# Patient Record
Sex: Male | Born: 1975 | Race: Black or African American | Hispanic: No | Marital: Single
Health system: Southern US, Community
[De-identification: ages and names within clinical notes are randomized; demographics above are authoritative.]

## PROBLEM LIST (undated history)

## (undated) DIAGNOSIS — D649 Anemia, unspecified: Secondary | ICD-10-CM

## (undated) DIAGNOSIS — F32A Depression, unspecified: Secondary | ICD-10-CM

## (undated) DIAGNOSIS — D709 Neutropenia, unspecified: Secondary | ICD-10-CM

## (undated) DIAGNOSIS — F329 Major depressive disorder, single episode, unspecified: Secondary | ICD-10-CM

## (undated) DIAGNOSIS — F419 Anxiety disorder, unspecified: Secondary | ICD-10-CM

## (undated) DIAGNOSIS — F209 Schizophrenia, unspecified: Secondary | ICD-10-CM

## (undated) DIAGNOSIS — G8220 Paraplegia, unspecified: Secondary | ICD-10-CM

---

## 2013-11-29 ENCOUNTER — Ambulatory Visit: Payer: Self-pay | Admitting: Internal Medicine

## 2013-12-07 ENCOUNTER — Inpatient Hospital Stay: Payer: Self-pay | Admitting: Internal Medicine

## 2013-12-07 ENCOUNTER — Encounter: Payer: Self-pay | Admitting: Surgery

## 2013-12-07 LAB — CBC
HCT: 24.8 % — ABNORMAL LOW (ref 40.0–52.0)
HGB: 7 g/dL — ABNORMAL LOW (ref 13.0–18.0)
MCH: 18.6 pg — AB (ref 26.0–34.0)
MCHC: 28 g/dL — ABNORMAL LOW (ref 32.0–36.0)
MCV: 66 fL — ABNORMAL LOW (ref 80–100)
Platelet: 544 10*3/uL — ABNORMAL HIGH (ref 150–440)
RBC: 3.75 10*6/uL — ABNORMAL LOW (ref 4.40–5.90)
RDW: 24.6 % — ABNORMAL HIGH (ref 11.5–14.5)
WBC: 4.6 10*3/uL (ref 3.8–10.6)

## 2013-12-07 LAB — IRON AND TIBC
IRON BIND. CAP.(TOTAL): 44 ug/dL — AB (ref 250–450)
Iron Saturation: 27 %
Iron: 12 ug/dL — ABNORMAL LOW (ref 65–175)
Unbound Iron-Bind.Cap.: 32 ug/dL

## 2013-12-07 LAB — COMPREHENSIVE METABOLIC PANEL
ANION GAP: 9 (ref 7–16)
Albumin: 0.7 g/dL — ABNORMAL LOW (ref 3.4–5.0)
Alkaline Phosphatase: 86 U/L
BILIRUBIN TOTAL: 0.3 mg/dL (ref 0.2–1.0)
BUN: 4 mg/dL — AB (ref 7–18)
CO2: 18 mmol/L — AB (ref 21–32)
Chloride: 118 mmol/L — ABNORMAL HIGH (ref 98–107)
Creatinine: 0.37 mg/dL — ABNORMAL LOW (ref 0.60–1.30)
Glucose: 61 mg/dL — ABNORMAL LOW (ref 65–99)
OSMOLALITY: 284 (ref 275–301)
POTASSIUM: 2.5 mmol/L — AB (ref 3.5–5.1)
SGOT(AST): 7 U/L — ABNORMAL LOW (ref 15–37)
SODIUM: 145 mmol/L (ref 136–145)
TOTAL PROTEIN: 4.4 g/dL — AB (ref 6.4–8.2)

## 2013-12-07 LAB — URINALYSIS, COMPLETE
BILIRUBIN, UR: NEGATIVE
Blood: NEGATIVE
GLUCOSE, UR: NEGATIVE mg/dL (ref 0–75)
Ketone: NEGATIVE
Nitrite: POSITIVE
Ph: 9 (ref 4.5–8.0)
Protein: 100
RBC,UR: 15 /HPF (ref 0–5)
Specific Gravity: 1.015 (ref 1.003–1.030)
Squamous Epithelial: NONE SEEN

## 2013-12-07 LAB — BASIC METABOLIC PANEL
Anion Gap: 7 (ref 7–16)
BUN: 6 mg/dL — AB (ref 7–18)
CREATININE: 0.5 mg/dL — AB (ref 0.60–1.30)
Calcium, Total: 7.4 mg/dL — ABNORMAL LOW (ref 8.5–10.1)
Chloride: 105 mmol/L (ref 98–107)
Co2: 27 mmol/L (ref 21–32)
Glucose: 90 mg/dL (ref 65–99)
OSMOLALITY: 275 (ref 275–301)
Potassium: 3.7 mmol/L (ref 3.5–5.1)
SODIUM: 139 mmol/L (ref 136–145)

## 2013-12-07 LAB — CK TOTAL AND CKMB (NOT AT ARMC)
CK, Total: 23 U/L — ABNORMAL LOW
CK-MB: 0.6 ng/mL (ref 0.5–3.6)

## 2013-12-07 LAB — TSH: THYROID STIMULATING HORM: 1.39 u[IU]/mL

## 2013-12-07 LAB — MAGNESIUM: Magnesium: 1.2 mg/dL — ABNORMAL LOW

## 2013-12-07 LAB — PHOSPHORUS: Phosphorus: 2 mg/dL — ABNORMAL LOW (ref 2.5–4.9)

## 2013-12-08 LAB — BASIC METABOLIC PANEL
ANION GAP: 7 (ref 7–16)
BUN: 3 mg/dL — ABNORMAL LOW (ref 7–18)
CALCIUM: 7.3 mg/dL — AB (ref 8.5–10.1)
CREATININE: 0.62 mg/dL (ref 0.60–1.30)
Chloride: 110 mmol/L — ABNORMAL HIGH (ref 98–107)
Co2: 26 mmol/L (ref 21–32)
EGFR (African American): >60
EGFR (Non-African Amer.): >60
Glucose: 117 mg/dL — ABNORMAL HIGH (ref 65–99)
Osmolality: 283 (ref 275–301)
Potassium: 3.6 mmol/L (ref 3.5–5.1)
Sodium: 143 mmol/L (ref 136–145)

## 2013-12-08 LAB — CBC WITH DIFFERENTIAL/PLATELET
BASOS ABS: 0 10*3/uL (ref 0.0–0.1)
BASOS PCT: 0.7 %
Eosinophil #: 0.1 10*3/uL (ref 0.0–0.7)
Eosinophil %: 1.9 %
HCT: 24.2 % — AB (ref 40.0–52.0)
HGB: 7.2 g/dL — ABNORMAL LOW (ref 13.0–18.0)
LYMPHS PCT: 35.4 %
Lymphocyte #: 1.4 10*3/uL (ref 1.0–3.6)
MCH: 19.9 pg — ABNORMAL LOW (ref 26.0–34.0)
MCHC: 29.6 g/dL — ABNORMAL LOW (ref 32.0–36.0)
MCV: 67 fL — ABNORMAL LOW (ref 80–100)
MONOS PCT: 15.3 %
Monocyte #: 0.6 x10 3/mm (ref 0.2–1.0)
NEUTROS ABS: 1.8 10*3/uL (ref 1.4–6.5)
Neutrophil %: 46.7 %
Platelet: 536 10*3/uL — ABNORMAL HIGH (ref 150–440)
RBC: 3.61 10*6/uL — AB (ref 4.40–5.90)
RDW: 24.7 % — AB (ref 11.5–14.5)
WBC: 4 10*3/uL (ref 3.8–10.6)

## 2013-12-08 LAB — MAGNESIUM: MAGNESIUM: 2 mg/dL

## 2013-12-09 LAB — BASIC METABOLIC PANEL
ANION GAP: 7 (ref 7–16)
BUN: 4 mg/dL — AB (ref 7–18)
Calcium, Total: 7.2 mg/dL — ABNORMAL LOW (ref 8.5–10.1)
Chloride: 112 mmol/L — ABNORMAL HIGH (ref 98–107)
Co2: 25 mmol/L (ref 21–32)
Creatinine: 0.57 mg/dL — ABNORMAL LOW (ref 0.60–1.30)
EGFR (African American): >60
GLUCOSE: 92 mg/dL (ref 65–99)
Osmolality: 283 (ref 275–301)
POTASSIUM: 3.4 mmol/L — AB (ref 3.5–5.1)
Sodium: 144 mmol/L (ref 136–145)

## 2013-12-09 LAB — CBC WITH DIFFERENTIAL/PLATELET
BASOS ABS: 0 10*3/uL (ref 0.0–0.1)
BASOS PCT: 0.7 %
Eosinophil #: 0.1 10*3/uL (ref 0.0–0.7)
Eosinophil %: 2.4 %
HCT: 24 % — ABNORMAL LOW (ref 40.0–52.0)
HGB: 7.1 g/dL — AB (ref 13.0–18.0)
Lymphocyte #: 1.3 10*3/uL (ref 1.0–3.6)
Lymphocyte %: 39.4 %
MCH: 19.4 pg — ABNORMAL LOW (ref 26.0–34.0)
MCHC: 29.4 g/dL — ABNORMAL LOW (ref 32.0–36.0)
MCV: 66 fL — ABNORMAL LOW (ref 80–100)
Monocyte #: 0.4 x10 3/mm (ref 0.2–1.0)
Monocyte %: 13.2 %
NEUTROS ABS: 1.5 10*3/uL (ref 1.4–6.5)
NEUTROS PCT: 44.3 %
PLATELETS: 568 10*3/uL — AB (ref 150–440)
RBC: 3.65 10*6/uL — ABNORMAL LOW (ref 4.40–5.90)
RDW: 24.8 % — AB (ref 11.5–14.5)
WBC: 3.4 10*3/uL — ABNORMAL LOW (ref 3.8–10.6)

## 2013-12-09 LAB — VANCOMYCIN, TROUGH: VANCOMYCIN, TROUGH: 10 ug/mL (ref 10–20)

## 2013-12-10 LAB — BASIC METABOLIC PANEL
ANION GAP: 4 — AB (ref 7–16)
BUN: 5 mg/dL — ABNORMAL LOW (ref 7–18)
CREATININE: 0.61 mg/dL (ref 0.60–1.30)
Calcium, Total: 7.4 mg/dL — ABNORMAL LOW (ref 8.5–10.1)
Chloride: 111 mmol/L — ABNORMAL HIGH (ref 98–107)
Co2: 30 mmol/L (ref 21–32)
EGFR (African American): >60
EGFR (Non-African Amer.): >60
Glucose: 79 mg/dL (ref 65–99)
Osmolality: 285 (ref 275–301)
Potassium: 3.7 mmol/L (ref 3.5–5.1)
Sodium: 145 mmol/L (ref 136–145)

## 2013-12-10 LAB — PHOSPHORUS: Phosphorus: 3.6 mg/dL (ref 2.5–4.9)

## 2013-12-10 LAB — VANCOMYCIN, TROUGH: Vancomycin, Trough: 36 ug/mL (ref 10–20)

## 2013-12-10 LAB — MAGNESIUM: Magnesium: 2 mg/dL

## 2013-12-11 LAB — BASIC METABOLIC PANEL
ANION GAP: 5 — AB (ref 7–16)
BUN: 4 mg/dL — ABNORMAL LOW (ref 7–18)
CHLORIDE: 113 mmol/L — AB (ref 98–107)
CO2: 29 mmol/L (ref 21–32)
Calcium, Total: 7.4 mg/dL — ABNORMAL LOW (ref 8.5–10.1)
Creatinine: 0.69 mg/dL (ref 0.60–1.30)
EGFR (Non-African Amer.): >60
Glucose: 64 mg/dL — ABNORMAL LOW (ref 65–99)
Osmolality: 287 (ref 275–301)
Potassium: 3.5 mmol/L (ref 3.5–5.1)
SODIUM: 147 mmol/L — AB (ref 136–145)

## 2013-12-11 LAB — CBC WITH DIFFERENTIAL/PLATELET
BASOS PCT: 0.7 %
Basophil #: 0 10*3/uL (ref 0.0–0.1)
Eosinophil #: 0.1 10*3/uL (ref 0.0–0.7)
Eosinophil %: 2.1 %
HCT: 24.2 % — AB (ref 40.0–52.0)
HGB: 6.9 g/dL — ABNORMAL LOW (ref 13.0–18.0)
Lymphocyte #: 1.5 10*3/uL (ref 1.0–3.6)
Lymphocyte %: 32.1 %
MCH: 19.1 pg — ABNORMAL LOW (ref 26.0–34.0)
MCHC: 28.6 g/dL — AB (ref 32.0–36.0)
MCV: 67 fL — ABNORMAL LOW (ref 80–100)
MONOS PCT: 12.5 %
Monocyte #: 0.6 x10 3/mm (ref 0.2–1.0)
NEUTROS PCT: 52.6 %
Neutrophil #: 2.5 10*3/uL (ref 1.4–6.5)
Platelet: 573 10*3/uL — ABNORMAL HIGH (ref 150–440)
RBC: 3.62 10*6/uL — ABNORMAL LOW (ref 4.40–5.90)
RDW: 24.6 % — ABNORMAL HIGH (ref 11.5–14.5)
WBC: 4.7 10*3/uL (ref 3.8–10.6)

## 2013-12-11 LAB — VANCOMYCIN, TROUGH: VANCOMYCIN, TROUGH: 22 ug/mL — AB (ref 10–20)

## 2013-12-12 LAB — CBC WITH DIFFERENTIAL/PLATELET
BASOS ABS: 0.1 10*3/uL (ref 0.0–0.1)
BASOS PCT: 1 %
EOS PCT: 1 %
Eosinophil #: 0.1 10*3/uL (ref 0.0–0.7)
HCT: 26.8 % — AB (ref 40.0–52.0)
HGB: 7.6 g/dL — ABNORMAL LOW (ref 13.0–18.0)
LYMPHS ABS: 1.5 10*3/uL (ref 1.0–3.6)
Lymphocyte %: 18.5 %
MCH: 19 pg — ABNORMAL LOW (ref 26.0–34.0)
MCHC: 28.5 g/dL — ABNORMAL LOW (ref 32.0–36.0)
MCV: 67 fL — ABNORMAL LOW (ref 80–100)
MONOS PCT: 14.1 %
Monocyte #: 1.1 x10 3/mm — ABNORMAL HIGH (ref 0.2–1.0)
NEUTROS ABS: 5.2 10*3/uL (ref 1.4–6.5)
NEUTROS PCT: 65.4 %
PLATELETS: 658 10*3/uL — AB (ref 150–440)
RBC: 4.01 10*6/uL — ABNORMAL LOW (ref 4.40–5.90)
RDW: 24.6 % — AB (ref 11.5–14.5)
WBC: 7.9 10*3/uL (ref 3.8–10.6)

## 2013-12-12 LAB — CULTURE, BLOOD (SINGLE)

## 2013-12-12 LAB — VANCOMYCIN, TROUGH: Vancomycin, Trough: 16 ug/mL (ref 10–20)

## 2013-12-12 LAB — WOUND CULTURE

## 2013-12-13 LAB — VANCOMYCIN, TROUGH: VANCOMYCIN, TROUGH: 16 ug/mL (ref 10–20)

## 2013-12-16 LAB — CULTURE, BLOOD (SINGLE)

## 2013-12-30 ENCOUNTER — Ambulatory Visit
Admit: 2013-12-30 | Disposition: A | Discharge status: Home / Self Care | Payer: Self-pay | Attending: Nurse Practitioner | Admitting: Nurse Practitioner

## 2013-12-30 ENCOUNTER — Ambulatory Visit: Payer: Self-pay | Admitting: Internal Medicine

## 2014-01-04 ENCOUNTER — Emergency Department: Payer: Self-pay | Admitting: Student

## 2014-02-07 ENCOUNTER — Encounter: Payer: Self-pay | Admitting: Surgery

## 2014-02-16 LAB — WOUND AEROBIC CULTURE

## 2014-03-01 ENCOUNTER — Encounter: Payer: Self-pay | Admitting: Surgery

## 2014-03-27 ENCOUNTER — Inpatient Hospital Stay: Payer: Self-pay | Admitting: Internal Medicine

## 2014-03-27 LAB — TROPONIN I

## 2014-03-27 LAB — URINALYSIS, COMPLETE
BILIRUBIN, UR: NEGATIVE
BLOOD: NEGATIVE
Glucose,UR: NEGATIVE mg/dL (ref 0–75)
KETONE: NEGATIVE
Nitrite: NEGATIVE
PH: 8 (ref 4.5–8.0)
RBC,UR: NONE SEEN /HPF (ref 0–5)
Specific Gravity: 1.012 (ref 1.003–1.030)
Squamous Epithelial: NONE SEEN

## 2014-03-27 LAB — PROTIME-INR
INR: 1.4
Prothrombin Time: 16.6 secs — ABNORMAL HIGH (ref 11.5–14.7)

## 2014-03-27 LAB — CBC WITH DIFFERENTIAL/PLATELET
BASOS ABS: 0 10*3/uL (ref 0.0–0.1)
Basophil %: 0.5 %
Eosinophil #: 0 10*3/uL (ref 0.0–0.7)
Eosinophil %: 0.2 %
HCT: 29.6 % — AB (ref 40.0–52.0)
HGB: 9 g/dL — AB (ref 13.0–18.0)
Lymphocyte #: 0.8 10*3/uL — ABNORMAL LOW (ref 1.0–3.6)
Lymphocyte %: 18.9 %
MCH: 22.1 pg — ABNORMAL LOW (ref 26.0–34.0)
MCHC: 30.5 g/dL — ABNORMAL LOW (ref 32.0–36.0)
MCV: 73 fL — AB (ref 80–100)
MONO ABS: 0.7 x10 3/mm (ref 0.2–1.0)
Monocyte %: 15.9 %
Neutrophil #: 2.9 10*3/uL (ref 1.4–6.5)
Neutrophil %: 64.5 %
Platelet: 421 10*3/uL (ref 150–440)
RBC: 4.07 10*6/uL — AB (ref 4.40–5.90)
RDW: 22.2 % — AB (ref 11.5–14.5)
WBC: 4.5 10*3/uL (ref 3.8–10.6)

## 2014-03-27 LAB — COMPREHENSIVE METABOLIC PANEL
ALT: 8 U/L — AB
Albumin: 1.1 g/dL — ABNORMAL LOW (ref 3.4–5.0)
Alkaline Phosphatase: 117 U/L — ABNORMAL HIGH
Anion Gap: 10 (ref 7–16)
BILIRUBIN TOTAL: 0.3 mg/dL (ref 0.2–1.0)
BUN: 6 mg/dL — AB (ref 7–18)
CO2: 27 mmol/L (ref 21–32)
Calcium, Total: 7.3 mg/dL — ABNORMAL LOW (ref 8.5–10.1)
Chloride: 104 mmol/L (ref 98–107)
Creatinine: 0.52 mg/dL — ABNORMAL LOW (ref 0.60–1.30)
EGFR (African American): >60
EGFR (Non-African Amer.): >60
GLUCOSE: 133 mg/dL — AB (ref 65–99)
OSMOLALITY: 281 (ref 275–301)
Potassium: 3.6 mmol/L (ref 3.5–5.1)
SGOT(AST): 31 U/L (ref 15–37)
Sodium: 141 mmol/L (ref 136–145)
TOTAL PROTEIN: 6 g/dL — AB (ref 6.4–8.2)

## 2014-03-27 LAB — PHOSPHORUS: Phosphorus: 3.4 mg/dL (ref 2.5–4.9)

## 2014-03-27 LAB — MAGNESIUM: Magnesium: 1.5 mg/dL — ABNORMAL LOW

## 2014-03-28 LAB — CBC WITH DIFFERENTIAL/PLATELET
BASOS ABS: 0 10*3/uL (ref 0.0–0.1)
BASOS PCT: 0.4 %
Eosinophil #: 0.1 10*3/uL (ref 0.0–0.7)
Eosinophil %: 2.2 %
HCT: 24.6 % — ABNORMAL LOW (ref 40.0–52.0)
HGB: 7.3 g/dL — ABNORMAL LOW (ref 13.0–18.0)
Lymphocyte #: 0.7 10*3/uL — ABNORMAL LOW (ref 1.0–3.6)
Lymphocyte %: 27.4 %
MCH: 21.6 pg — ABNORMAL LOW (ref 26.0–34.0)
MCHC: 29.7 g/dL — AB (ref 32.0–36.0)
MCV: 73 fL — ABNORMAL LOW (ref 80–100)
Monocyte #: 0.4 x10 3/mm (ref 0.2–1.0)
Monocyte %: 14.8 %
NEUTROS PCT: 55.2 %
Neutrophil #: 1.4 10*3/uL (ref 1.4–6.5)
Platelet: 355 10*3/uL (ref 150–440)
RBC: 3.38 10*6/uL — ABNORMAL LOW (ref 4.40–5.90)
RDW: 21.7 % — ABNORMAL HIGH (ref 11.5–14.5)
WBC: 2.6 10*3/uL — ABNORMAL LOW (ref 3.8–10.6)

## 2014-03-28 LAB — BASIC METABOLIC PANEL
ANION GAP: 7 (ref 7–16)
BUN: 4 mg/dL — ABNORMAL LOW (ref 7–18)
CALCIUM: 6.6 mg/dL — AB (ref 8.5–10.1)
CO2: 26 mmol/L (ref 21–32)
CREATININE: 0.5 mg/dL — AB (ref 0.60–1.30)
Chloride: 114 mmol/L — ABNORMAL HIGH (ref 98–107)
EGFR (African American): >60
EGFR (Non-African Amer.): >60
Glucose: 104 mg/dL — ABNORMAL HIGH (ref 65–99)
Osmolality: 290 (ref 275–301)
POTASSIUM: 2.9 mmol/L — AB (ref 3.5–5.1)
Sodium: 147 mmol/L — ABNORMAL HIGH (ref 136–145)

## 2014-03-28 LAB — IRON AND TIBC
IRON: 15 ug/dL — AB (ref 65–175)
Iron Bind.Cap.(Total): 76 ug/dL — ABNORMAL LOW (ref 250–450)
Iron Saturation: 20 %
UNBOUND IRON-BIND. CAP.: 61 ug/dL

## 2014-03-28 LAB — URINE CULTURE

## 2014-03-28 LAB — VANCOMYCIN, TROUGH: Vancomycin, Trough: 19 ug/mL (ref 10–20)

## 2014-03-28 LAB — FERRITIN: Ferritin (ARMC): 38 ng/mL (ref 8–388)

## 2014-03-28 LAB — OCCULT BLOOD X 1 CARD TO LAB, STOOL: OCCULT BLOOD, FECES: NEGATIVE

## 2014-03-29 LAB — CBC WITH DIFFERENTIAL/PLATELET
BASOS PCT: 0.3 %
Basophil #: 0 10*3/uL (ref 0.0–0.1)
Eosinophil #: 0.1 10*3/uL (ref 0.0–0.7)
Eosinophil %: 2.2 %
HCT: 25.8 % — ABNORMAL LOW (ref 40.0–52.0)
HGB: 7.7 g/dL — AB (ref 13.0–18.0)
LYMPHS PCT: 37.4 %
Lymphocyte #: 1.1 10*3/uL (ref 1.0–3.6)
MCH: 22 pg — AB (ref 26.0–34.0)
MCHC: 30 g/dL — AB (ref 32.0–36.0)
MCV: 73 fL — ABNORMAL LOW (ref 80–100)
MONOS PCT: 14.3 %
Monocyte #: 0.4 x10 3/mm (ref 0.2–1.0)
NEUTROS PCT: 45.8 %
Neutrophil #: 1.4 10*3/uL (ref 1.4–6.5)
Platelet: 414 10*3/uL (ref 150–440)
RBC: 3.52 10*6/uL — AB (ref 4.40–5.90)
RDW: 22.3 % — AB (ref 11.5–14.5)
WBC: 3 10*3/uL — AB (ref 3.8–10.6)

## 2014-03-29 LAB — ALBUMIN: ALBUMIN: 1 g/dL — AB (ref 3.4–5.0)

## 2014-03-29 LAB — VANCOMYCIN, TROUGH: Vancomycin, Trough: 18 ug/mL (ref 10–20)

## 2014-03-29 LAB — BASIC METABOLIC PANEL
ANION GAP: 4 — AB (ref 7–16)
BUN: 3 mg/dL — AB (ref 7–18)
CALCIUM: 6.7 mg/dL — AB (ref 8.5–10.1)
CHLORIDE: 116 mmol/L — AB (ref 98–107)
Co2: 27 mmol/L (ref 21–32)
Creatinine: 0.54 mg/dL — ABNORMAL LOW (ref 0.60–1.30)
EGFR (African American): >60
EGFR (Non-African Amer.): >60
Glucose: 100 mg/dL — ABNORMAL HIGH (ref 65–99)
Osmolality: 289 (ref 275–301)
POTASSIUM: 3.6 mmol/L (ref 3.5–5.1)
SODIUM: 147 mmol/L — AB (ref 136–145)

## 2014-03-29 LAB — MAGNESIUM: Magnesium: 1.8 mg/dL

## 2014-03-30 LAB — CBC WITH DIFFERENTIAL/PLATELET
BASOS ABS: 0 10*3/uL (ref 0.0–0.1)
Basophil %: 0.2 %
Eosinophil #: 0.1 10*3/uL (ref 0.0–0.7)
Eosinophil %: 2.8 %
HCT: 25.6 % — AB (ref 40.0–52.0)
HGB: 7.8 g/dL — AB (ref 13.0–18.0)
LYMPHS ABS: 1.1 10*3/uL (ref 1.0–3.6)
Lymphocyte %: 35.6 %
MCH: 22.1 pg — ABNORMAL LOW (ref 26.0–34.0)
MCHC: 30.3 g/dL — ABNORMAL LOW (ref 32.0–36.0)
MCV: 73 fL — ABNORMAL LOW (ref 80–100)
MONOS PCT: 11.8 %
Monocyte #: 0.4 x10 3/mm (ref 0.2–1.0)
Neutrophil #: 1.5 10*3/uL (ref 1.4–6.5)
Neutrophil %: 49.6 %
Platelet: 432 10*3/uL (ref 150–440)
RBC: 3.51 10*6/uL — ABNORMAL LOW (ref 4.40–5.90)
RDW: 21.7 % — ABNORMAL HIGH (ref 11.5–14.5)
WBC: 3.1 10*3/uL — ABNORMAL LOW (ref 3.8–10.6)

## 2014-03-30 LAB — BASIC METABOLIC PANEL
Anion Gap: 4 — ABNORMAL LOW (ref 7–16)
BUN: 4 mg/dL — AB (ref 7–18)
CHLORIDE: 116 mmol/L — AB (ref 98–107)
Calcium, Total: 6.7 mg/dL — CL (ref 8.5–10.1)
Co2: 26 mmol/L (ref 21–32)
Creatinine: 0.63 mg/dL (ref 0.60–1.30)
EGFR (African American): >60
EGFR (Non-African Amer.): >60
GLUCOSE: 230 mg/dL — AB (ref 65–99)
Osmolality: 295 (ref 275–301)
Potassium: 5.5 mmol/L — ABNORMAL HIGH (ref 3.5–5.1)
Sodium: 146 mmol/L — ABNORMAL HIGH (ref 136–145)

## 2014-03-30 LAB — POTASSIUM: Potassium: 4.4 mmol/L (ref 3.5–5.1)

## 2014-03-30 LAB — ALBUMIN: Albumin: 1 g/dL — ABNORMAL LOW (ref 3.4–5.0)

## 2014-03-31 LAB — URINE CULTURE

## 2014-04-01 LAB — CULTURE, BLOOD (SINGLE)

## 2014-06-01 ENCOUNTER — Ambulatory Visit
Admit: 2014-06-01 | Disposition: A | Discharge status: Home / Self Care | Payer: Self-pay | Attending: Nurse Practitioner | Admitting: Nurse Practitioner

## 2014-08-31 ENCOUNTER — Ambulatory Visit: Payer: Self-pay | Admitting: Nurse Practitioner

## 2014-09-22 NOTE — Consult Note (Signed)
Brief Consult Note: Diagnosis: massive sacral decubitus ulcer, severe malnutrition, traumatic paraplegia.   Patient was seen by consultant.   Consult note dictated.   Discussed with Attending MD.   Comments: not source of sepsis on admission. no surgical intervention needed at this time would refer to OP tertiary care center for plastic surgery evaluation.  Electronic Signatures: Natale LayBird, Oseph Imburgia (MD)  (Signed 29-Oct-15 11:29)  Authored: Brief Consult Note   Last Updated: 29-Oct-15 11:29 by Natale LayBird, Mikaelah Trostle (MD)
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
## 2015-03-14 ENCOUNTER — Emergency Department: Payer: Medicare Other

## 2015-03-14 ENCOUNTER — Encounter: Payer: Self-pay | Admitting: Emergency Medicine

## 2015-03-14 ENCOUNTER — Emergency Department
Admission: EM | Admit: 2015-03-14 | Discharge: 2015-03-14 | Payer: Medicare Other | Attending: Emergency Medicine | Admitting: Emergency Medicine

## 2015-03-14 DIAGNOSIS — F209 Schizophrenia, unspecified: Secondary | ICD-10-CM | POA: Diagnosis not present

## 2015-03-14 DIAGNOSIS — I959 Hypotension, unspecified: Secondary | ICD-10-CM | POA: Diagnosis not present

## 2015-03-14 DIAGNOSIS — E876 Hypokalemia: Secondary | ICD-10-CM | POA: Diagnosis not present

## 2015-03-14 DIAGNOSIS — Z79899 Other long term (current) drug therapy: Secondary | ICD-10-CM | POA: Diagnosis not present

## 2015-03-14 HISTORY — DX: Neutropenia, unspecified: D70.9

## 2015-03-14 HISTORY — DX: Depression, unspecified: F32.A

## 2015-03-14 HISTORY — DX: Major depressive disorder, single episode, unspecified: F32.9

## 2015-03-14 HISTORY — DX: Paraplegia, unspecified: G82.20

## 2015-03-14 HISTORY — DX: Schizophrenia, unspecified: F20.9

## 2015-03-14 HISTORY — DX: Anemia, unspecified: D64.9

## 2015-03-14 HISTORY — DX: Anxiety disorder, unspecified: F41.9

## 2015-03-14 LAB — CBC WITH DIFFERENTIAL/PLATELET
Basophils Absolute: 0 10*3/uL (ref 0–0.1)
Basophils Relative: 1 %
EOS ABS: 0 10*3/uL (ref 0–0.7)
Eosinophils Relative: 0 %
HEMATOCRIT: 28.1 % — AB (ref 40.0–52.0)
Hemoglobin: 8.5 g/dL — ABNORMAL LOW (ref 13.0–18.0)
LYMPHS ABS: 2.2 10*3/uL (ref 1.0–3.6)
Lymphocytes Relative: 26 %
MCH: 22.5 pg — AB (ref 26.0–34.0)
MCHC: 30.2 g/dL — AB (ref 32.0–36.0)
MCV: 74.6 fL — ABNORMAL LOW (ref 80.0–100.0)
MONOS PCT: 11 %
Monocytes Absolute: 1 10*3/uL (ref 0.2–1.0)
NEUTROS ABS: 5.2 10*3/uL (ref 1.4–6.5)
NEUTROS PCT: 62 %
Platelets: 592 10*3/uL — ABNORMAL HIGH (ref 150–440)
RBC: 3.77 MIL/uL — AB (ref 4.40–5.90)
RDW: 22.8 % — ABNORMAL HIGH (ref 11.5–14.5)
WBC: 8.4 10*3/uL (ref 3.8–10.6)

## 2015-03-14 LAB — COMPREHENSIVE METABOLIC PANEL
ALT: 11 U/L — ABNORMAL LOW (ref 17–63)
ANION GAP: 9 (ref 5–15)
AST: 20 U/L (ref 15–41)
Albumin: 1.1 g/dL — ABNORMAL LOW (ref 3.5–5.0)
Alkaline Phosphatase: 118 U/L (ref 38–126)
BUN: 6 mg/dL (ref 6–20)
CHLORIDE: 107 mmol/L (ref 101–111)
CO2: 19 mmol/L — AB (ref 22–32)
Calcium: 7.1 mg/dL — ABNORMAL LOW (ref 8.9–10.3)
Creatinine, Ser: 0.39 mg/dL — ABNORMAL LOW (ref 0.61–1.24)
Glucose, Bld: 77 mg/dL (ref 65–99)
POTASSIUM: 2.6 mmol/L — AB (ref 3.5–5.1)
SODIUM: 135 mmol/L (ref 135–145)
Total Bilirubin: 0.5 mg/dL (ref 0.3–1.2)
Total Protein: 5.2 g/dL — ABNORMAL LOW (ref 6.5–8.1)

## 2015-03-14 LAB — TROPONIN I

## 2015-03-14 LAB — BLOOD GAS, VENOUS
Acid-base deficit: 4.5 mmol/L — ABNORMAL HIGH (ref 0.0–2.0)
Bicarbonate: 19.2 mEq/L — ABNORMAL LOW (ref 21.0–28.0)
FIO2: 0.21
O2 SAT: 63.6 %
PCO2 VEN: 31 mmHg — AB (ref 44.0–60.0)
PH VEN: 7.4 (ref 7.320–7.430)
PO2 VEN: 33 mmHg (ref 30.0–45.0)
Patient temperature: 37

## 2015-03-14 LAB — VALPROIC ACID LEVEL: Valproic Acid Lvl: <10 ug/mL — ABNORMAL LOW (ref 50.0–100.0)

## 2015-03-14 LAB — URINALYSIS COMPLETE WITH MICROSCOPIC (ARMC ONLY)
BILIRUBIN URINE: NEGATIVE
GLUCOSE, UA: NEGATIVE mg/dL
HGB URINE DIPSTICK: NEGATIVE
KETONES UR: NEGATIVE mg/dL
NITRITE: NEGATIVE
PH: 8 (ref 5.0–8.0)
Protein, ur: 100 mg/dL — AB
SPECIFIC GRAVITY, URINE: 1.01 (ref 1.005–1.030)
Squamous Epithelial / LPF: NONE SEEN

## 2015-03-14 LAB — LACTIC ACID, PLASMA: Lactic Acid, Venous: 1.3 mmol/L (ref 0.5–2.0)

## 2015-03-14 LAB — PROTIME-INR
INR: 1.51
PROTHROMBIN TIME: 18.4 s — AB (ref 11.4–15.0)

## 2015-03-14 LAB — MAGNESIUM: Magnesium: 1.6 mg/dL — ABNORMAL LOW (ref 1.7–2.4)

## 2015-03-14 MED ORDER — SODIUM CHLORIDE 0.9 % IV BOLUS (SEPSIS): 500.0000 mL | Freq: Once | INTRAVENOUS | Status: DC

## 2015-03-14 MED ORDER — SODIUM CHLORIDE 0.9 % IV BOLUS (SEPSIS)
1000.0000 mL | INTRAVENOUS | Status: AC
  Administered 2015-03-14 (×2): 1000 mL via INTRAVENOUS

## 2015-03-14 MED ORDER — PIPERACILLIN-TAZOBACTAM 3.375 G IVPB
3.3750 g | Freq: Three times a day (TID) | INTRAVENOUS | Status: DC
  Administered 2015-03-14: 3.375 g via INTRAVENOUS
  Filled 2015-03-14: qty 50

## 2015-03-14 MED ORDER — POTASSIUM CHLORIDE CRYS ER 20 MEQ PO TBCR
20.0000 meq | EXTENDED_RELEASE_TABLET | Freq: Once | ORAL | Status: AC
  Administered 2015-03-14: 20 meq via ORAL

## 2015-03-14 MED ORDER — POTASSIUM CHLORIDE CRYS ER 20 MEQ PO TBCR
40.0000 meq | EXTENDED_RELEASE_TABLET | Freq: Once | ORAL | Status: AC
  Administered 2015-03-14: 40 meq via ORAL
  Filled 2015-03-14: qty 2

## 2015-03-14 MED ORDER — SODIUM CHLORIDE 0.9 % IV BOLUS (SEPSIS): 1000.0000 mL | Freq: Once | INTRAVENOUS | Status: DC

## 2015-03-14 MED ORDER — VANCOMYCIN HCL IN DEXTROSE 1-5 GM/200ML-% IV SOLN
1000.0000 mg | Freq: Once | INTRAVENOUS | Status: AC
  Administered 2015-03-14: 1000 mg via INTRAVENOUS
  Filled 2015-03-14: qty 200

## 2015-03-14 MED ORDER — PIPERACILLIN-TAZOBACTAM 3.375 G IVPB 30 MIN
3.3750 g | Freq: Once | INTRAVENOUS | Status: AC
  Administered 2015-03-14: 3.375 g via INTRAVENOUS
  Filled 2015-03-14: qty 50

## 2015-03-14 MED ORDER — POTASSIUM CHLORIDE CRYS ER 20 MEQ PO TBCR
40.0000 meq | EXTENDED_RELEASE_TABLET | Freq: Once | ORAL | Status: DC
  Filled 2015-03-14: qty 2
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
## 2015-03-14 NOTE — ED Notes (Signed)
Pt refused to get an IV catheter inserted. MD notified.

## 2015-03-14 NOTE — Progress Notes (Signed)
ANTIBIOTIC CONSULT NOTE - INITIAL  Pharmacy Consult for Zosyn Indication: rule out sepsis  No Known Allergies  Patient Measurements: Height: 5\' 9"  (175.3 cm) Weight: 122 lb 8 oz (55.566 kg) IBW/kg (Calculated) : 70.7  Vital Signs: Temp: 98.8 F (37.1 C) (10/13 1805) Temp Source: Rectal (10/13 1805) BP: 72/57 mmHg (10/13 1855) Pulse Rate: 113 (10/13 1855) Intake/Output from previous day:   Intake/Output from this shift:    Labs:  Recent Labs  03/14/15 1734  WBC 8.4  HGB 8.5*  PLT 592*  CREATININE 0.39*   Estimated Creatinine Clearance: 95.6 mL/min (by C-G formula based on Cr of 0.39). No results for input(s): VANCOTROUGH, VANCOPEAK, VANCORANDOM, GENTTROUGH, GENTPEAK, GENTRANDOM, TOBRATROUGH, TOBRAPEAK, TOBRARND, AMIKACINPEAK, AMIKACINTROU, AMIKACIN in the last 72 hours.   Microbiology: No results found for this or any previous visit (from the past 720 hour(s)).  Medical History: Past Medical History  Diagnosis Date  . Paraplegia (HCC)   . Schizophrenia (HCC)   . Anxiety   . Neutropenia (HCC)   . Anemia   . Depression     Medications:  Anti-infectives    Start     Dose/Rate Route Frequency Ordered Stop   03/14/15 1800  piperacillin-tazobactam (ZOSYN) IVPB 3.375 g     3.375 g 100 mL/hr over 30 Minutes Intravenous  Once 03/14/15 1757 03/14/15 1833   03/14/15 1745  vancomycin (VANCOCIN) IVPB 1000 mg/200 mL premix     1,000 mg 200 mL/hr over 60 Minutes Intravenous  Once 03/14/15 1734 03/14/15 1909     Assessment: 39 yo male who presents from a nursing facility to the emergency room due to being tachycardic and hypotensive consistent with signs of sepsis; patient is afebrile and WBC WNL.  Pharmacy consulted to dose zosyn.  Goal of Therapy:  Resolution of infection  Plan:  Start zosyn 3.375g IV Q8H EI infusion based on current renal function. Follow up culture results  Pharmacy will continue to follow with you. Thank you for the consult.  Maryjo RochesterHaley Garrette Caine,  PharmD Clinical Pharmacist 03/14/2015,7:09 PM

## 2015-03-14 NOTE — ED Notes (Signed)
Patient to ED via [facility], EMS was called to [facility] for hypotension and possible pressure ulcer.  BP for EMS 84/50, HR 132, O2 sat 96% on room air. On arrival to ED patient was found to be hypotensive and tachycardic.

## 2015-03-15 ENCOUNTER — Telehealth: Payer: Self-pay | Admitting: Emergency Medicine

## 2015-03-15 ENCOUNTER — Inpatient Hospital Stay (HOSPITAL_COMMUNITY)
Admission: AD | Admit: 2015-03-15 | Discharge: 2015-04-02 | DRG: 871 | Disposition: E | Payer: Medicare Other | Source: Other Acute Inpatient Hospital | Attending: Internal Medicine | Admitting: Internal Medicine

## 2015-03-15 DIAGNOSIS — E43 Unspecified severe protein-calorie malnutrition: Secondary | ICD-10-CM | POA: Diagnosis present

## 2015-03-15 DIAGNOSIS — I9589 Other hypotension: Secondary | ICD-10-CM | POA: Diagnosis present

## 2015-03-15 DIAGNOSIS — R7881 Bacteremia: Secondary | ICD-10-CM

## 2015-03-15 DIAGNOSIS — Z515 Encounter for palliative care: Secondary | ICD-10-CM | POA: Insufficient documentation

## 2015-03-15 DIAGNOSIS — E876 Hypokalemia: Secondary | ICD-10-CM | POA: Diagnosis present

## 2015-03-15 DIAGNOSIS — R627 Adult failure to thrive: Secondary | ICD-10-CM | POA: Diagnosis present

## 2015-03-15 DIAGNOSIS — R000 Tachycardia, unspecified: Secondary | ICD-10-CM | POA: Diagnosis present

## 2015-03-15 DIAGNOSIS — B961 Klebsiella pneumoniae [K. pneumoniae] as the cause of diseases classified elsewhere: Secondary | ICD-10-CM | POA: Diagnosis present

## 2015-03-15 DIAGNOSIS — L8990 Pressure ulcer of unspecified site, unspecified stage: Secondary | ICD-10-CM | POA: Insufficient documentation

## 2015-03-15 DIAGNOSIS — Z9119 Patient's noncompliance with other medical treatment and regimen: Secondary | ICD-10-CM

## 2015-03-15 DIAGNOSIS — Z681 Body mass index (BMI) 19 or less, adult: Secondary | ICD-10-CM

## 2015-03-15 DIAGNOSIS — G8220 Paraplegia, unspecified: Secondary | ICD-10-CM | POA: Diagnosis present

## 2015-03-15 DIAGNOSIS — A411 Sepsis due to other specified staphylococcus: Principal | ICD-10-CM | POA: Diagnosis present

## 2015-03-15 DIAGNOSIS — F259 Schizoaffective disorder, unspecified: Secondary | ICD-10-CM | POA: Diagnosis present

## 2015-03-15 DIAGNOSIS — R6520 Severe sepsis without septic shock: Secondary | ICD-10-CM | POA: Diagnosis not present

## 2015-03-15 DIAGNOSIS — F419 Anxiety disorder, unspecified: Secondary | ICD-10-CM | POA: Diagnosis present

## 2015-03-15 DIAGNOSIS — Z66 Do not resuscitate: Secondary | ICD-10-CM | POA: Diagnosis present

## 2015-03-15 DIAGNOSIS — R109 Unspecified abdominal pain: Secondary | ICD-10-CM

## 2015-03-15 DIAGNOSIS — A419 Sepsis, unspecified organism: Secondary | ICD-10-CM

## 2015-03-15 DIAGNOSIS — F203 Undifferentiated schizophrenia: Secondary | ICD-10-CM | POA: Diagnosis not present

## 2015-03-15 DIAGNOSIS — N390 Urinary tract infection, site not specified: Secondary | ICD-10-CM | POA: Diagnosis present

## 2015-03-15 DIAGNOSIS — I959 Hypotension, unspecified: Secondary | ICD-10-CM | POA: Diagnosis present

## 2015-03-15 DIAGNOSIS — R64 Cachexia: Secondary | ICD-10-CM | POA: Diagnosis present

## 2015-03-15 DIAGNOSIS — Z1611 Resistance to penicillins: Secondary | ICD-10-CM | POA: Diagnosis present

## 2015-03-15 DIAGNOSIS — L89150 Pressure ulcer of sacral region, unstageable: Secondary | ICD-10-CM | POA: Diagnosis present

## 2015-03-15 DIAGNOSIS — B958 Unspecified staphylococcus as the cause of diseases classified elsewhere: Secondary | ICD-10-CM | POA: Diagnosis present

## 2015-03-15 DIAGNOSIS — D509 Iron deficiency anemia, unspecified: Secondary | ICD-10-CM | POA: Diagnosis present

## 2015-03-15 DIAGNOSIS — Z933 Colostomy status: Secondary | ICD-10-CM

## 2015-03-15 DIAGNOSIS — Z5329 Procedure and treatment not carried out because of patient's decision for other reasons: Secondary | ICD-10-CM | POA: Diagnosis present

## 2015-03-15 DIAGNOSIS — F209 Schizophrenia, unspecified: Secondary | ICD-10-CM | POA: Diagnosis not present

## 2015-03-15 LAB — MRSA PCR SCREENING: MRSA BY PCR: POSITIVE — AB

## 2015-03-15 MED ORDER — FAMOTIDINE IN NACL 20-0.9 MG/50ML-% IV SOLN
20.0000 mg | Freq: Two times a day (BID) | INTRAVENOUS | Status: DC
  Administered 2015-03-15 (×2): 20 mg via INTRAVENOUS
  Filled 2015-03-15 (×5): qty 50

## 2015-03-15 MED ORDER — HALOPERIDOL LACTATE 5 MG/ML IJ SOLN: 1.0000 mg | INTRAMUSCULAR | Status: DC | PRN

## 2015-03-15 MED ORDER — SODIUM CHLORIDE 0.9 % IV BOLUS (SEPSIS)
250.0000 mL | Freq: Once | INTRAVENOUS | Status: AC
  Administered 2015-03-15: 250 mL via INTRAVENOUS

## 2015-03-15 MED ORDER — PIPERACILLIN-TAZOBACTAM 3.375 G IVPB
3.3750 g | Freq: Three times a day (TID) | INTRAVENOUS | Status: DC
  Administered 2015-03-15 (×2): 3.375 g via INTRAVENOUS
  Filled 2015-03-15 (×6): qty 50

## 2015-03-15 MED ORDER — SODIUM CHLORIDE 0.9 % IV SOLN
500.0000 mg | Freq: Three times a day (TID) | INTRAVENOUS | Status: DC
  Administered 2015-03-15 (×2): 500 mg via INTRAVENOUS
  Filled 2015-03-15 (×6): qty 500

## 2015-03-15 MED ORDER — DIVALPROEX SODIUM 125 MG PO CSDR
375.0000 mg | DELAYED_RELEASE_CAPSULE | Freq: Two times a day (BID) | ORAL | Status: DC
  Administered 2015-03-15 (×2): 375 mg via ORAL
  Filled 2015-03-15 (×10): qty 3

## 2015-03-15 MED ORDER — CHLORHEXIDINE GLUCONATE CLOTH 2 % EX PADS: 6.0000 | MEDICATED_PAD | Freq: Every day | CUTANEOUS | Status: AC

## 2015-03-15 MED ORDER — SODIUM CHLORIDE 0.9 % IV SOLN
INTRAVENOUS | Status: DC
  Administered 2015-03-15 (×2): via INTRAVENOUS

## 2015-03-15 MED ORDER — MUPIROCIN 2 % EX OINT
1.0000 "application " | TOPICAL_OINTMENT | Freq: Two times a day (BID) | CUTANEOUS | Status: AC
  Administered 2015-03-15: 1 via NASAL
  Filled 2015-03-15: qty 22

## 2015-03-15 MED ORDER — LORAZEPAM 2 MG/ML IJ SOLN: 1.0000 mg | Freq: Three times a day (TID) | INTRAMUSCULAR | Status: DC | PRN

## 2015-03-15 MED ORDER — SODIUM CHLORIDE 0.9 % IV SOLN: 250.0000 mL | INTRAVENOUS | Status: DC | PRN

## 2015-03-15 MED ORDER — HEPARIN SODIUM (PORCINE) 5000 UNIT/ML IJ SOLN
5000.0000 [IU] | Freq: Three times a day (TID) | INTRAMUSCULAR | Status: DC
  Administered 2015-03-15: 5000 [IU] via SUBCUTANEOUS
  Filled 2015-03-15 (×6): qty 1

## 2015-03-15 NOTE — Progress Notes (Signed)
PCCM Attending Note:  Stopped by nurse during walk rounds. Patient pulled out his IV access. Patient has been refusing wound care and changing of ostomy bags. Patient has refused to have his IV access replaced. Patient is oriented to the year and president. He is aware that he is in hospital. He believed that he was in [facility] however. When asked if the patient would allow nursing staff to attempt IV access he declined. When I asked him what his reasoning was he could not relay this. I explained that if the patient does not receive proper medical care through IV antibiotics and further imaging his condition could worsen and he could potentially die. I asked the patient if he was willing to accept this risk and he said yes but again could not further elaborate. I made our [facility] physician aware of his lack of IV access and also discuss this with his bedside nurse. We would have to administer IM sedation for placement of IV access and given the patient's current state of mind I feel this goes against his wishes. We will continue to monitor the patient closely in the intensive care unit.  Donna ChristenJennings E. Jamison NeighborNestor, M.D. [facility] Pager:  [phone] After 3pm or if no response, call [phone]

## 2015-03-15 NOTE — Consult Note (Addendum)
WOC wound consult note Reason for Consult: Consult requested for multiple chronic wounds.  Pt refuses to allow surgical team to assess sites, then refused [facility] to [facility] and again when primary team physician requested.  He refused to have his ostomy bags changed.  There are photos in the EMR for wounds, and measurements and stages were documented in the nursing flow sheet.  Pt is on a Sport low airloss bed to reduce pressure. Moist gauze dressing order placed if patient allows the bedside nurse to change dressings at some point.  Urostomy pouch and colostomy pouch supplies are at bedside for staff nurse use if patient allows. Pressure Ulcer POA: Yes to multiple sites Please re-consult if further assistance is needed.  Thank-you,  Cammie Mcgeeawn Toula Miyasaki MSN, RN, CWOCN, [place]CN-AP, CNS [phone]
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
## 2015-03-15 NOTE — Progress Notes (Signed)
Tried to change the urostomy bag patient was refusing and kind of upset he wants me out of the room. Reassurance given and inspite of explaining to patient the importance  of changing the entire bag to prevent further skin damage still refused to change the bag.

## 2015-03-15 NOTE — Progress Notes (Signed)
Patient ID: Allen Silva, male   DOB: 1976-03-09, 39 y.o.   MRN: [id number] Patient adamantly refuses for myself or Dawn, WOC, RN to look at his wound.  He is refusing to have an IV stick so he can get a CT scan.  [facility] but EMS brought him here.  His pictures look clean, but without being able to further evaluate these areas, further infection or osteo can not be determined.  We will sign off at this point.  Devaunte Gasparini E 9:56 AM Dec 22, 2014

## 2015-03-15 NOTE — Progress Notes (Signed)
Patient refused blood drawn and  refusing to be  repositioned and turn. Pulled his IV out from the left arm and refused to be stuck again. CT scan called and needs a peripheral IV gauge 20, cannot use the EJ for CT. Dr. Jimmey RalphParker is made aware of refusing treatment inspite of calming instruction, encouragement and emphazising the importance  of the treatment. Patient continue to be non-compliant. Will follow up this morning with the rounding doctor.

## 2015-03-15 NOTE — Progress Notes (Signed)
Mr [name] has refused to be assessed, lab draws, iv sticks and dressing changes. Many attempts have been made to do each of these things. The patient pulled out his EJ and refused to have new iv placed. MD made aware. Will continue to monitor at this time.
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
## 2015-03-15 NOTE — Progress Notes (Signed)
[facility] critical lab value blood cultures [facility] in clusters and notified Dr. Isaiah SergeMannam eMD.  No orders at this time.

## 2015-03-16 DIAGNOSIS — Z515 Encounter for palliative care: Secondary | ICD-10-CM | POA: Insufficient documentation

## 2015-03-16 LAB — URINE CULTURE

## 2015-03-16 MED ORDER — FAMOTIDINE 20 MG PO TABS
20.0000 mg | ORAL_TABLET | Freq: Two times a day (BID) | ORAL | Status: DC
  Filled 2015-03-16 (×2): qty 1

## 2015-03-16 MED ORDER — POTASSIUM CHLORIDE CRYS ER 20 MEQ PO TBCR
40.0000 meq | EXTENDED_RELEASE_TABLET | Freq: Once | ORAL | Status: DC
  Filled 2015-03-16: qty 2

## 2015-03-16 MED ORDER — HALOPERIDOL LACTATE 5 MG/ML IJ SOLN: 5.0000 mg | Freq: Two times a day (BID) | INTRAMUSCULAR | Status: DC

## 2015-03-16 MED ORDER — LINEZOLID 600 MG PO TABS
600.0000 mg | ORAL_TABLET | Freq: Two times a day (BID) | ORAL | Status: DC
  Administered 2015-03-22: 600 mg via ORAL
  Filled 2015-03-16 (×23): qty 1

## 2015-03-16 NOTE — Progress Notes (Signed)
Palliative Consult received. Pt unable to participate in GOC discussion. He has been refusing all care. I asked pt why he came to the hospital and he said for " a check up". I asked him if he still desired that and he repleied no he wanted to go back home. He appears to be attending to internal stimuli, paranoid. Called Lynett FishGail Henly who is listed as legal guardian at [phone]. She informed me she could not meet in person but we arranged phone conversation for 0900 03/17/15 @ [phone].  Thank you for consulting Palliative Medicine Eduard RouxSarah Arthur Speagle, ANP-ACHPN

## 2015-03-16 NOTE — Progress Notes (Signed)
Patient alert and oriented x3. He refused medications, dressing changes, turns, and hygiene. Patient threatening to "punch" staff if we were to touch him.Dr. Vassie LollAlva made aware event. Dr. Vassie LollAlva will consult with Psych.

## 2015-03-16 NOTE — Progress Notes (Signed)
Patient was approached again at 15:30 and told him that I have to change his dressings. Patient continues to refuse with a big no, came back at dinner time bringing his tray, stayed with him and helped set his tray, ate his meal without complaints but is not answering my questions.
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
## 2015-03-16 NOTE — Progress Notes (Signed)
Patient refused assessment, dressing changes as well as 10 pm medications. He stated "I just want to be left alone.". Will try again later, will continue to monitor.  Cindee SaltMcBride,Kaelani Kendrick K, RN

## 2015-03-16 NOTE — Progress Notes (Signed)
Received patient from 61M, patient refused to be touched when I was about to check/assess him, was in a very bed mood, will try again later as he might calm down.

## 2015-03-17 DIAGNOSIS — I959 Hypotension, unspecified: Secondary | ICD-10-CM

## 2015-03-17 MED ORDER — MORPHINE SULFATE (CONCENTRATE) 10 MG/0.5ML PO SOLN
5.0000 mg | ORAL | Status: DC | PRN
Start: 2015-03-17 — End: 2015-03-19

## 2015-03-17 NOTE — Progress Notes (Signed)
Patient continues to refuse all care. Dressing changes, turning and repositioning, as well as emptying of ostomy. Will continue to educate and try. Cindee SaltMcBride,Demarrion Meiklejohn K, RN

## 2015-03-17 NOTE — Progress Notes (Addendum)
Patient is still refusing all care. Strongly voices a NO when talked to about dressing changes and turning/repositioning. Continues to refuse to even allow us to look at ostomy or do any kind of assessment. Patient is also refusing cardiac monitoring. Will continue to educate and monitor. Cindee SaltMcBride,Karolyna Bianchini K, RN
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
## 2015-03-17 NOTE — Progress Notes (Signed)
Patient has refused all care this morning, including assessment and medications.

## 2015-03-17 NOTE — ED Notes (Signed)
Lab

## 2015-03-18 DIAGNOSIS — F209 Schizophrenia, unspecified: Secondary | ICD-10-CM

## 2015-03-18 DIAGNOSIS — F203 Undifferentiated schizophrenia: Secondary | ICD-10-CM | POA: Diagnosis present

## 2015-03-18 DIAGNOSIS — R7881 Bacteremia: Secondary | ICD-10-CM

## 2015-03-18 LAB — CULTURE, BLOOD (ROUTINE X 2)

## 2015-03-18 MED ORDER — ENSURE ENLIVE PO LIQD: 237.0000 mL | Freq: Two times a day (BID) | ORAL | Status: DC

## 2015-03-18 MED ORDER — ADULT MULTIVITAMIN W/MINERALS CH: 1.0000 | ORAL_TABLET | Freq: Every day | ORAL | Status: DC

## 2015-03-18 MED ORDER — PRO-STAT SUGAR FREE PO LIQD
30.0000 mL | Freq: Two times a day (BID) | ORAL | Status: DC
  Administered 2015-03-23: 30 mL via ORAL
  Filled 2015-03-18 (×4): qty 30
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
## 2015-03-18 NOTE — Progress Notes (Signed)
Pt has refused all care and medication today during first shift. I attempted multiples time to convince him otherwise but it was unsuccessful. He actually got more aggressive verbally  and raised his voice when I insisted. Attending physician aware of the pt behaviors.  Colleen Canesar Letica Giaimo, RN
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
## 2015-03-18 NOTE — Progress Notes (Signed)
Utilization review completed.  
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
## 2015-03-18 NOTE — Progress Notes (Signed)
At beginning of shift patient agreed to ostomy change, since it had busted. Allowed a "wash up" bed and gown change. Allowed small parts of assessment such as lungs, heart, brief look over of skin. Most of his dressings have come off. I cleansed and redressed as many as he would allow. Right lower back/hip area had a large amount of foul smelling yellow drainage, entire bed was saturated; this dressing was replaced. Most on right side of body I was able to replace. However when we rolled him over so I could see left side, he stated "ok I'm tired of you now, you can go". Still tried to cover a few of those wounds until he became more aggressive. Since that time patient has again refused all care. He has refused dressing changes, vital signs, lab, medications. Only asks for pepsi/coke and then kicks us out. Will continue to educate patient and monitor situation. Cindee SaltMcBride,Kenyon Eshleman K, RN

## 2015-03-19 MED ORDER — LORAZEPAM 2 MG/ML PO CONC: 1.0000 mg | Freq: Three times a day (TID) | ORAL | Status: DC

## 2015-03-19 MED ORDER — MORPHINE SULFATE (CONCENTRATE) 10 MG/0.5ML PO SOLN: 10.0000 mg | Freq: Three times a day (TID) | ORAL | Status: DC

## 2015-03-19 MED ORDER — DIVALPROEX SODIUM 125 MG PO CSDR
500.0000 mg | DELAYED_RELEASE_CAPSULE | Freq: Two times a day (BID) | ORAL | Status: DC
  Administered 2015-03-22 – 2015-03-24 (×3): 500 mg via ORAL
  Filled 2015-03-19 (×15): qty 4

## 2015-03-19 NOTE — Progress Notes (Signed)
Pt has been calm this shift, refused meals, meds, and dsg changes. He did let me change his wet linen after several attempts. Dsg are saturated with drainage, new opening beside right of penis( when turned to change linen- copious amt purulent drainage came rolling ou)t. He has not seemed to be in any distress/ pain  this  shift.
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
## 2015-03-19 NOTE — Progress Notes (Signed)
I spoke with Dondra SpryGail his HCPOA. She says do everything you can do "I have heard this before". I explained to her the very serious nature of his condition and our inability to treat his current condition due to patient refusing treatment or care of any kind. There is clearly a decompensated psychitric condition as well as pain and suffering. Will attempt to administer pain and agitation medicine by alternate routes. Dondra SpryGail said "do what you have to do"- I asked her how she would feel if that meant providing comfort care and giving him some dignity at EOL and she abruptly told me that she was going to talk to her family and that I needed to call her back before noon tomorrow with an update.  Will follow.  Anderson MaltaElizabeth Ameya Kutz, DO Palliative Medicine

## 2015-03-19 NOTE — Clinical Social Work Note (Signed)
CSW received referral stating patient admitted from [facility] SNF.  [facility].  Allen Silva, [place] - [phone] Clinical Social Work Department Orthopedics [phone]([id number]) and Surgical [phone]([id number])

## 2015-03-20 NOTE — Progress Notes (Signed)
Checking on pt if he has any needs he wants met at this time. Pt replied no.
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
## 2015-03-20 NOTE — Progress Notes (Signed)
Linen change this am, noted dressing to all wounds are soaked , offered dressing to be change but patient dtated "  No, I am good". Will monitor.

## 2015-03-20 NOTE — Progress Notes (Signed)
Called MPOA who answered phone before I could say hello, by screaming "I told you I had to be at work by 12:45 and NO ONE has called me.. I told you to call me earlier (referring to palliative care)".  Aunt states that only GOD knows when it is a person's time and that her family does not believe in hospice but in the next sentence said  "I have family members on hospice and they don't do anything for them".  I told the MPOA that he is suffering. His psychiatric diagnosis was making it difficult to treat--- she again screamed that he does not have any psychiatric issues- "ya'll are putting stuff on him that he dose not have, he just makes bad decisions."   She again stated that GOD will take care of him and to "do the best you can."  She then said " I have to go to work, I am in the medical field" and hung up the phone.  Marlin CanaryJessica Coree Riester

## 2015-03-20 NOTE — Progress Notes (Signed)
Pt has not had any meds at all on this shift. Refused all cares

## 2015-03-20 NOTE — Progress Notes (Signed)
Pt refusing all cares including assessments ,medicines and dressing changes. Gets verbally abusive quickly on nursing interventions

## 2015-03-20 NOTE — Progress Notes (Signed)
Pt continues to refuse all meds and cares

## 2015-03-21 DIAGNOSIS — R7881 Bacteremia: Secondary | ICD-10-CM

## 2015-03-21 DIAGNOSIS — F203 Undifferentiated schizophrenia: Secondary | ICD-10-CM

## 2015-03-21 LAB — URINE CULTURE: Culture: >=100000

## 2015-03-21 MED ORDER — FENTANYL 12 MCG/HR TD PT72
12.5000 ug | MEDICATED_PATCH | TRANSDERMAL | Status: DC
  Administered 2015-03-21: 12.5 ug via TRANSDERMAL
  Filled 2015-03-21: qty 1

## 2015-03-21 MED ORDER — HALOPERIDOL DECANOATE 100 MG/ML IM SOLN
50.0000 mg | INTRAMUSCULAR | Status: DC
  Administered 2015-03-21: 50 mg via INTRAMUSCULAR
  Filled 2015-03-21: qty 0.5

## 2015-03-21 MED ORDER — MORPHINE SULFATE (CONCENTRATE) 10 MG/0.5ML PO SOLN
10.0000 mg | Freq: Three times a day (TID) | ORAL | Status: DC | PRN
  Administered 2015-03-22: 10 mg via ORAL
  Filled 2015-03-21: qty 0.5

## 2015-03-21 NOTE — Progress Notes (Signed)
Daily Progress Note   Patient Name: Allen Silva       Date: 03/21/2015 DOB: 04-Oct-1975  Age: 39 y.o. MRN#: [id number] Attending Physician: Christiane Ha, MD Primary Care Physician: No primary care provider on file. Admit Date: 03/29/2015  Reason for Consultation/Follow-up: Establishing goals of care, Non pain symptom management and Other  Assessment: Mr. Granito continues to refuse care and becomes physically aggressive with staff when they try to care for him. Multiple skilled facilities have refused to accept him back as a patient because of his difficult and unpredictable behavior. He has extensive wounds that need regular care but he will not allow this to be done, he has bacteremia for which we have not been able to maintain IV access to administer antibiotics. He will not take any medication voluntarily. He receives a depot injection of Haldol and it is unclear how and who administer this regularly and it does not appear to be controlling his agitation and combative symptoms.  Recommendations:  1. Failure of Guardian to make decisions- she is essentially not making any decisions, she is not physically present, difficult phone communications (I attempted to contact her X2 on 10/19 before 1245PM), does not have basic insight into his condition. I recommend guardianship be pursued- but in his condition he may not survive long enough for that to actually occur. We are unable to provide him with IV antibiotics or treat his wounds due to uncontrolled combative behavior as well as his outright refusal which may be his reaction to the pain he endures with every dressing change and also with all of the medical interventions he has undergone possibly unwillingly in his life cause PTSD reactions and severe anxiety-neither of which can be medicated.   2. Pain/Symptoms: We can attempt to place a TD Fentanyl patch - nursing needs to provide medications through alternative means since he doesn't  have capacity for decision making ane his guardian is telling us to use whatever means necessary to administer medication.  3. I strongly recommend re-consideration for psychiatric facility if the direction is to continue to pursue medical interventions aimed at cure- I would also support a move towards full comfort care and hospice facility placement given his clear refusal to EVEN be touched and now bacteremia. It is almost impossible to determine what an acceptable QOL for this gentleman would be or look like and his condition is not reversible in terms of his wounds and functional status recovery- and his HCPOA has not provided information on this when asked in order to guide [place] in making goal concordant medical decisions for him.  Interval Events: Admitted 10/14 Length of Stay: 6 days  Current Medications: Scheduled Meds:  . divalproex  500 mg Oral BID  . feeding supplement (ENSURE ENLIVE)  237 mL Oral BID BM  . feeding supplement (PRO-STAT SUGAR FREE 64)  30 mL Oral BID  . haloperidol decanoate  50 mg Intramuscular Q14 Days  . linezolid  600 mg Oral Q12H  . potassium chloride  40 mEq Oral Once    Continuous Infusions:    PRN Meds: morphine CONCENTRATE  Palliative Performance Scale: 30%     Vital Signs: BP 91/53 mmHg  Pulse 119  Temp(Src) 97.7 F (36.5 C) (Oral)  Resp 16  Ht  (1.803 m)  Wt 59.058 kg (130 lb 3.2 oz)  BMI 18.17 kg/m2  SpO2 100% SpO2: SpO2: 100 % O2 Device: O2 Device: Not Delivered O2 Flow Rate:    Intake/output summary:  Intake/Output Summary (Last 24 hours) at 03/21/15 1309 Last data filed at 03/21/15 0613  Gross per 24 hour  Intake    522 ml  Output   1060 ml  Net   -538 ml   LBM:   Baseline Weight: Weight: 54.8 kg (120 lb 13 oz) Most recent weight: Weight: 59.058 kg (130 lb 3.2 oz)  Physical Exam: Unable to obtain  Additional Data Reviewed: No results for input(s): WBC, HGB, PLT, NA, BUN, CREATININE in the last 72  hours.  Invalid input(s): ALB   Problem List:  Patient Active Problem List   Diagnosis Date Noted  . Bacteremia 03/18/2015  . Undifferentiated schizophrenia (HCC) 03/18/2015  . Palliative care encounter   . Pressure ulcer 03/19/2015  . Hypotension 03/03/2015  . Severe sepsis [facility](HCC)          Care plan was discussed with attending.  Thank you for allowing the Palliative Medicine Team to assist in the care of this patient.  Time: 1PM- 1:35PM  Greater than 50%  of this time was spent counseling and coordinating care related to the above assessment and plan.   Edsel PetrinElizabeth L Trang Bouse, DO  03/21/2015, 1:09 PM  Please contact Palliative Medicine Team phone at [phone] for questions and concerns.

## 2015-03-21 NOTE — Progress Notes (Signed)
Pt's room has a foul odor but he resists any interaction and attempts to clean him up or change his dressings. Refused all medicines today again

## 2015-03-21 NOTE — Progress Notes (Signed)
 50mg  im haldol administered to left anterior thigh. Offered to change dressings again but pt declined using profane language to tell me to get out of his room

## 2015-03-21 NOTE — Progress Notes (Addendum)
Discussed with Dr. Phillips OdorGolding.   I do not think HCPOA is willing or able to make decisions for this patient as evidenced by her hanging up on Dr. Benjamine MolaVann 10/19 and [facility]. Dr. Phillips OdorGolding agrees. D/w social work.  Irving Burtonmily recommends ethics consult to help with decision about HCPOA/decision makemer Also, patient not taking meds and likely has pain. Will order duragesic 12.5 mcg.  Also, since refusing all meds and also swung at Dr. Benjamine MolaVann, will reconsult psychiatry. Might need admission for psychiatric medication adjustment. [place]?  Crista Curborinna Kijana Estock, MD [facility]

## 2015-03-21 NOTE — Progress Notes (Signed)
Pt already refusing all cares this morning. Declined care twice
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
## 2015-03-21 NOTE — Progress Notes (Signed)
Pt finally agreed to let us clean him up, change his colostomy and urostomy bags as there were both leaking. Pt was soaked in fecal matter at that time. Linens also changed but pt would not let me change his dressings despite multiple attempts to do so. Fentanyl patch applied to right arm

## 2015-03-21 NOTE — Progress Notes (Signed)
Pt refused care and all meds despite explaining to pt the importance of them.

## 2015-03-22 DIAGNOSIS — Z515 Encounter for palliative care: Secondary | ICD-10-CM

## 2015-03-22 NOTE — Progress Notes (Signed)
Pt more agreeable and calmer today. Pt allowed me to take some vital signs, and to sit him up in bed. Pt still eats none to very little of food and refuses supplements.
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
## 2015-03-22 NOTE — Progress Notes (Signed)
Mr. [name] seems to be much calmer with the addition of the Duragesic patch and more agreeable to basic care being provided. Appreciate Dr. Lendell CapriceSullivan meeting with family today and I am happy to support a transition to hospice care. I anticipate he will deteriorate rapidly as his infection progresses.   If he tolerates the Fentanyl patch would consider increasing to 25mcg in 48-72 hours. Palliative team is available over the weekend if there are questions or needs please contact them otherwise I will follow up on Monday with the family.  Anderson MaltaElizabeth Leul Narramore, DO Palliative Medicine [phone]

## 2015-03-22 NOTE — Progress Notes (Signed)
Staff nurse in to do shift assessment.  Nurse tech made me aware that 10pm blood pressure was 70/55.  Blood pressure rechecked.  Noted BP 60/44.  Noted that pt. Is asymptotic.  He is alert and oriented to person and place.  I have asked him to let me start IV line in case fluids are needed and he has refused.  He has refused any medications at this time.  While trying to retake blood pressure pt. Is yelling no do not take and to leave him alone.  I have texted paged on call MD for [facility] and informed of above information.

## 2015-03-22 NOTE — Clinical Social Work Note (Signed)
Clinical Social Work Assessment  Patient Details  Name: Jackey Housey MRN: [id number] Date of Birth: [birth year]  Date of referral:  03/22/15               Reason for consult:  Facility Placement, Discharge Planning                Permission sought to share information with:  Facility Sport and exercise psychologist, Family Supports Permission granted to share information::  Yes, Verbal Permission Granted  Name::     Daulton Harbaugh  Agency::  [facility]  Relationship::  Aunt / Administrator, arts Information:  [phone]  Housing/Transportation Living arrangements for the past 2 months:  [place] [facility] Source of Information:  Guardian Patient Interpreter Needed:  None Criminal Activity/Legal Involvement Pertinent to Current Situation/Hospitalization:  No - Comment as needed Significant Relationships:  Other Family Members (Guardian) Lives with:  Facility Resident ([facility]) Do you feel safe going back to the place where you live?  Yes (Family requesting placement closer in proximity to patient's HCPOA) Need for family participation in patient care:  Yes (Comment) (Patient's Guardian assisting with discharge planning needs.)  Care giving concerns:  Patient's Guardian expressed multiple concerns regarding patient's disposition. Per patient's Guardian, patient's family does not spiritually agree with Hospice services. Patient's Guardian requesting time to consult with patient's family.   Social Worker assessment / plan:  CSW following patient's care and disposition planning since admission. Patient's Guardian presented at bedside on 03/22/2015. CSW and MD met with patient's Guardian privately outside of patient's room. Patient's Guardian verbally expressed understanding of patient's prognosis and patient's refusal of services while at [facility]. CSW and MD presented Hospice services to patient's family as discharge disposition option. Patient's  Guardian states family does not spiritually agree with Hospice services, but "if he has to go I want the one in [place]." CSW provided more information regarding [facility]. Patient's Guardian stated patient's family would NOT be able to make final disposition decision in 24 hours. CSW and MD explained necessity for patient's family to discuss and decide discharge disposition. Patient's Guardian expressed understanding. CSW to continue to follow and assist with discharge planning needs.  Employment status:  Disabled (Comment on whether or not currently receiving Disability) Insurance information:  Medicare, Medicaid In [place] PT Recommendations:  Not assessed at this time Information / Referral to community resources:  Other (Comment Required) [facility])  Patient/Family's Response to care:  Patient's Guardian expressed understanding of patient's care, but states patient has been refusing care for months.  Patient/Family's Understanding of and Emotional Response to Diagnosis, Current Treatment, and Prognosis:  Patient's Guardian expressed understanding of patient's current prognosis and inability to treat patient while patient actively refuses services.  Emotional Assessment Appearance:  Other (Comment Required (CSW spoke with patient's HCPOA and MD outside of patient's room.) Attitude/Demeanor/Rapport:  Other (CSW spoke with patient's HCPOA and MD outside of patient's room.) Affect (typically observed):  Other (CSW spoke with patient's HCPOA and MD outside of patient's room.) Orientation:  Oriented to Self, Oriented to Place Alcohol / Substance use:  Not Applicable Psych involvement (Current and /or in the community):  Yes (Comment) (Per Psych MD, patient lacks capacity at this time.)  Discharge Needs  Concerns to be addressed:  Patient refuses services Readmission within the last 30 days:  No Current discharge risk:  None Barriers to Discharge:  No Barriers  Identified   Caroline Sauger, LCSW 03/22/2015, 1:34  PM [phone]

## 2015-03-23 MED ORDER — FENTANYL 25 MCG/HR TD PT72
25.0000 ug | MEDICATED_PATCH | TRANSDERMAL | Status: DC
  Administered 2015-03-24: 25 ug via TRANSDERMAL
  Filled 2015-03-23: qty 1

## 2015-03-23 NOTE — Progress Notes (Signed)
Patient allowed us to give him a bath this morning.  He also cooperated in having several of his dressings (allevyn) changed in his buttocks, upper back and legs.  Refused Xyvox after repeated attempts.
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
## 2015-03-24 DIAGNOSIS — E43 Unspecified severe protein-calorie malnutrition: Secondary | ICD-10-CM | POA: Diagnosis present
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
## 2015-03-24 NOTE — Progress Notes (Signed)
Patient refused Zyvox and Depakote despite 3 attempts.

## 2015-03-24 NOTE — Progress Notes (Signed)
CNA and this nurse allowed to do bed bath, change linen and dressing change at back on patient around 0320 but refused to have his colostomy and urostomy checked.  Patient's BP is low at 52/35 but no complain of dizziness and resting comfortably on bed as of this writing.

## 2015-03-24 NOTE — Progress Notes (Addendum)
Patient allowed this nurse to check colostomy and urostomy bags and noted both are empty.  Patient still refuses to take po meds despite mix in coke and repeated attempts.  Noted BP to be low at 54/35 and pulse rate at 111 thru [employer] and patient very weak but mumbled speech.  CNA and this nurse straighten patient on bed and elevate HOB. O2Sat at 100% room air.

## 2015-03-24 NOTE — Clinical Social Work Note (Signed)
CSW contacted [facility], [phone] and they said the hospice home has to be called on Monday morning after they open.  They can not take patient today, CSW to continue to follow patient's progress.  Ervin KnackEric R. Mary-Anne Polizzi, MSW, LCSWA [phone] 03/24/2015 10:50 AM
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
## 2015-04-02 NOTE — Progress Notes (Signed)
Body sent to morgue with Death Certificate.  Medical Examiner to see body in morgue later today.  Bed control notified.  Lynett FishGail Miggins, Legal Guardian of deceased pt. had not call back to give the name of funeral parlor.

## 2015-04-02 NOTE — Progress Notes (Signed)
Pt.'s Aunt/Legal guardian Lynett FishGail Ovitt just called and informed this nurse of the funeral Parlor in [place].  Bed Control notified and provided the funeral home's contact number.

## 2015-04-02 NOTE — Progress Notes (Signed)
Patient pronounced dead by this nurse and Charge nurse, Leonie ManNancy Irish at [phone].  Legal Guardian/Aunt Lynett FishGail Hollabaugh notified and will call back to give the name of the funeral parlor.  Floor night MD notified for Death Certificate issuance.  [facility] notified with referral no. [id number].  Charge Nurse coordinating with Medical Examiner per advise from Bed control.

## 2015-04-02 DEATH — deceased

## 2015-07-14 IMAGING — CR DG CHEST 1V PORT
1 series · 1 of 1 positions shown · non-contrast
Comparison: 01/04/2014

CLINICAL DATA: Sepsis

EXAM:
PORTABLE CHEST - 1 VIEW

[ap]
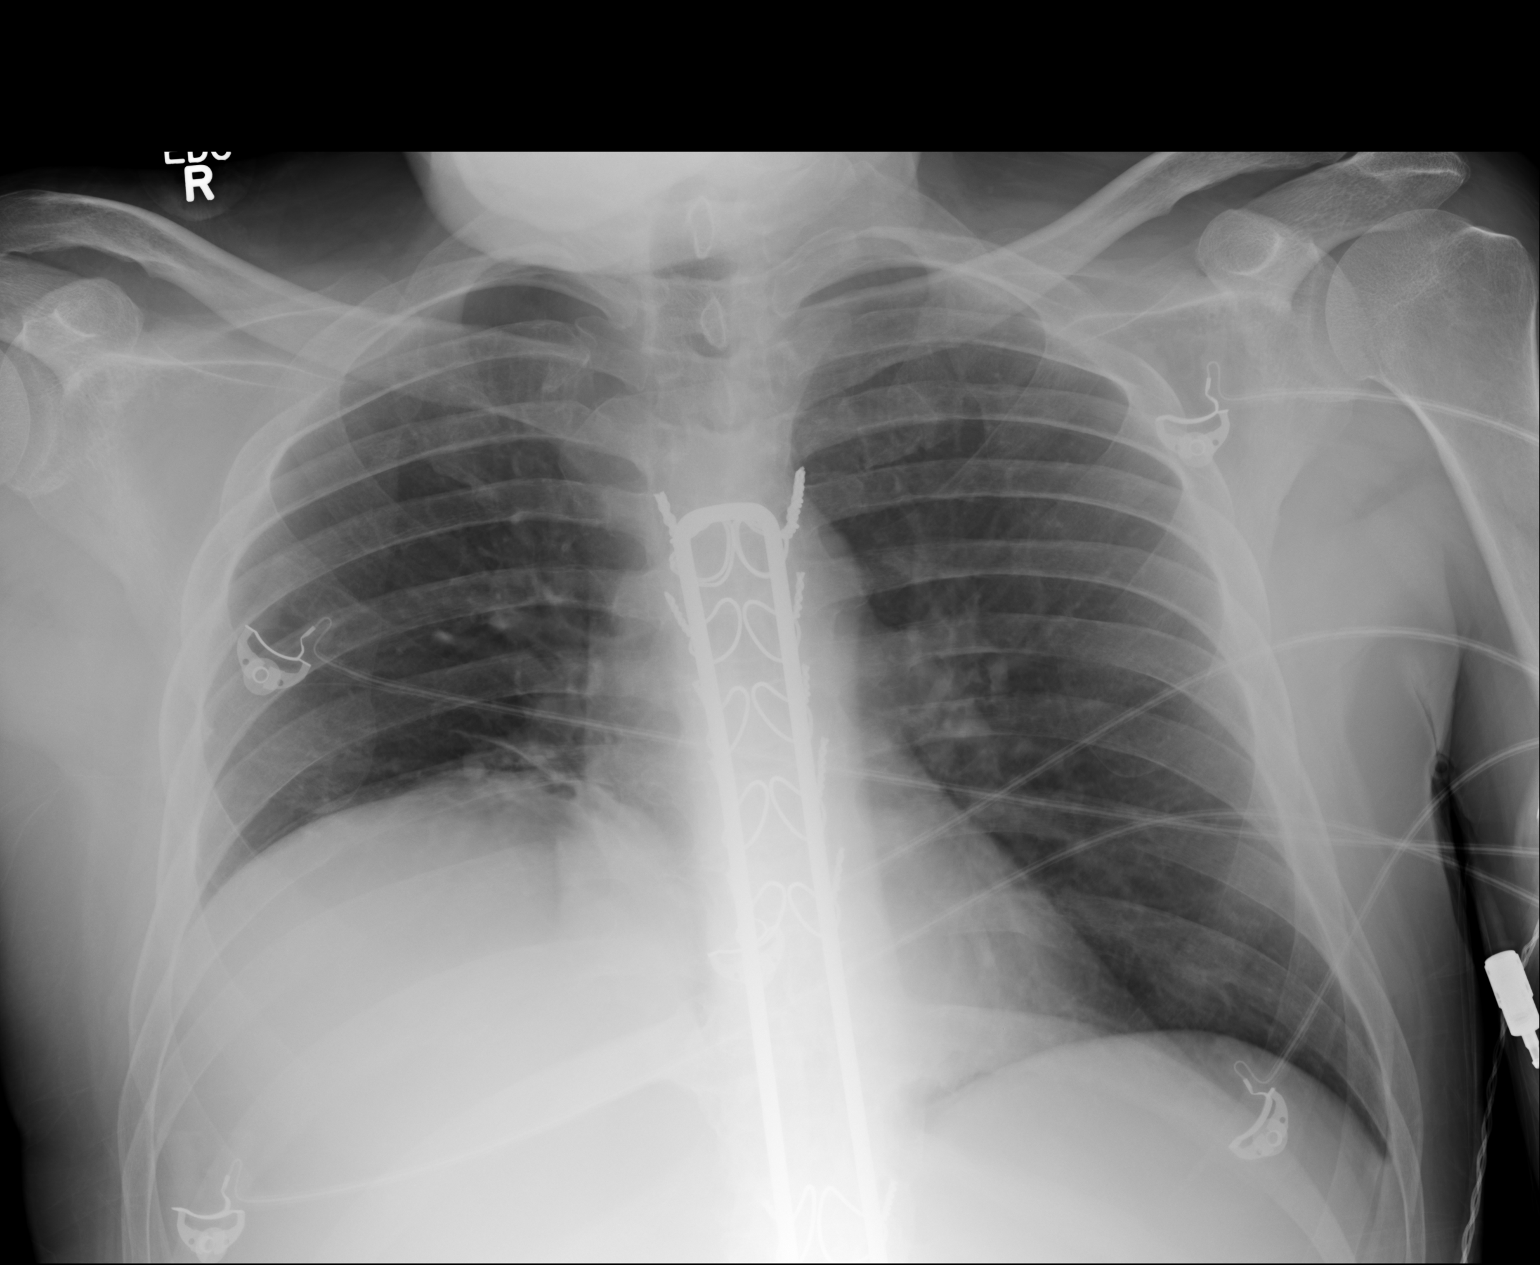

[1 of 1 positions shown; findings below may reference images not displayed]

FINDINGS: Elevated right hemidiaphragm, partially obscures the right lung
base. Lungs are otherwise clear. Cannot exclude a small right
pleural effusion in this setting. No pneumothorax. Cardiomediastinal
contours are similar to prior. Interval removal of the left PICC.
Partially imaged thoracolumbar fusion.
IMPRESSION: Elevated right hemidiaphragm.  No acute process identified.

## 2019-12-19 NOTE — Progress Notes (Signed)
This encounter was created in error - please disregard.
